# Patient Record
Sex: Male | Born: 1985 | Race: Black or African American | Hispanic: No | Marital: Single | Smoking: Current every day smoker
Health system: Southern US, Community
[De-identification: ages and names within clinical notes are randomized; demographics above are authoritative.]

## PROBLEM LIST (undated history)

## (undated) DIAGNOSIS — J45909 Unspecified asthma, uncomplicated: Secondary | ICD-10-CM

## (undated) HISTORY — PX: APPENDECTOMY: SHX54

---

## 2020-01-24 ENCOUNTER — Encounter: Payer: Self-pay | Admitting: Emergency Medicine

## 2020-01-24 ENCOUNTER — Ambulatory Visit
Admission: EM | Admit: 2020-01-24 | Discharge: 2020-01-24 | Disposition: A | Discharge status: Home / Self Care | Payer: Self-pay | Attending: Family Medicine | Admitting: Family Medicine

## 2020-01-24 ENCOUNTER — Ambulatory Visit (INDEPENDENT_AMBULATORY_CARE_PROVIDER_SITE_OTHER): Payer: Self-pay

## 2020-01-24 ENCOUNTER — Other Ambulatory Visit: Payer: Self-pay

## 2020-01-24 DIAGNOSIS — M25531 Pain in right wrist: Secondary | ICD-10-CM

## 2020-01-24 DIAGNOSIS — M79641 Pain in right hand: Secondary | ICD-10-CM

## 2020-01-24 HISTORY — DX: Unspecified asthma, uncomplicated: J45.909

## 2020-01-24 MED ORDER — KETOROLAC TROMETHAMINE 10 MG PO TABS: 10.0000 mg | ORAL_TABLET | Freq: Four times a day (QID) | ORAL | 0 refills | Status: DC | PRN

## 2020-01-24 NOTE — ED Triage Notes (Signed)
Patient states he punched a wall on Friday. He is now c/o right hand and wrist pain and swelling.

## 2020-01-24 NOTE — Discharge Instructions (Signed)
Rest, ice, elevation. ° °Medication as directed. ° °Take care ° °Dr. Kahmya Pinkham  °

## 2020-01-24 NOTE — ED Provider Notes (Signed)
[facility]    CSN: [id number] Arrival date & time: 01/24/20  5093      History   Chief Complaint Chief Complaint  Patient presents with  . Hand Injury  . Wrist Pain   HPI 34 year old male presents with the above complaint.  Patient reports that he got angry and punched a concrete wall on Friday.  Patient reports that he injured his right hand and wrist.  He reports severe pain of the right hand and wrist.  He has swelling of the digits.  No relieving factors.  Rates his pain as 9/10 in severity.  Exacerbated by touch and activity.  No other associated symptoms.  No other complaints.  Past Medical History:  Diagnosis Date  . Asthma    Past Surgical History:  Procedure Laterality Date  . APPENDECTOMY     Home Medications    Prior to Admission medications   Medication Sig Start Date End Date Taking? Authorizing Provider  ketorolac (TORADOL) 10 MG tablet Take 1 tablet (10 mg total) by mouth every 6 (six) hours as needed for moderate pain or severe pain. 01/24/20   Tommie Sams, DO   Social History Social History   Tobacco Use  . Smoking status: Current Every Day Smoker  . Smokeless tobacco: Never Used  Vaping Use  . Vaping Use: Never used  Substance Use Topics  . Alcohol use: Yes  . Drug use: Never     Allergies   Patient has no known allergies.   Review of Systems Review of Systems  Constitutional: Negative for fever.  Musculoskeletal:       Right hand and wrist pain.   Physical Exam Triage Vital Signs ED Triage Vitals  Enc Vitals Group     BP 01/24/20 0934 (!) 146/119     Pulse Rate 01/24/20 0934 98     Resp 01/24/20 0934 18     Temp 01/24/20 0934 98.3 F (36.8 C)     Temp Source 01/24/20 0934 Oral     SpO2 01/24/20 0934 98 %     Weight 01/24/20 0931 175 lb (79.4 kg)     Height 01/24/20 0931 5\' 7"  (1.702 m)     Head Circumference --      Peak Flow --      Pain Score 01/24/20 0931 9     Pain Loc --      Pain Edu? --      Excl. in  GC? --    Updated Vital Signs BP (!) 146/119 (BP Location: Left Arm)   Pulse 98   Temp 98.3 F (36.8 C) (Oral)   Resp 18   Ht 5\' 7"  (1.702 m)   Wt 79.4 kg   SpO2 98%   BMI 27.41 kg/m   Visual Acuity Right Eye Distance:   Left Eye Distance:   Bilateral Distance:    Right Eye Near:   Left Eye Near:    Bilateral Near:     Physical Exam Vitals and nursing note reviewed.  Constitutional:      General: He is not in acute distress.    Appearance: Normal appearance. He is not ill-appearing.  HENT:     Head: Normocephalic and atraumatic.  Eyes:     General:        Right eye: No discharge.        Left eye: No discharge.     Conjunctiva/sclera: Conjunctivae normal.  Pulmonary:     Effort: Pulmonary effort is normal. No  respiratory distress.  Musculoskeletal:     Comments: Right hand and wrist -diffuse swelling of the digits noted.  Patient exquisitely tender diffusely.  Most predominantly over the right middle finger as well as the ulnar aspect of the wrist.  Neurological:     Mental Status: He is alert.  Psychiatric:        Mood and Affect: Mood normal.        Behavior: Behavior normal.    UC Treatments / Results  Labs (all labs ordered are listed, but only abnormal results are displayed) Labs Reviewed - No data to display  EKG   Radiology DG Wrist Complete Right  Result Date: 01/24/2020 CLINICAL DATA:  Wrist pain after punching a wall. EXAM: RIGHT WRIST - COMPLETE 3+ VIEW COMPARISON:  Right hand radiographs 11/03/2017 FINDINGS: There is soft tissue swelling about the wrist. No fracture or dislocation is identified. Joint space widths are preserved. IMPRESSION: Soft tissue swelling without acute osseous abnormality. Electronically Signed   By: Sebastian Ache M.D.   On: 01/24/2020 10:08   DG Hand Complete Right  Result Date: 01/24/2020 CLINICAL DATA:  Hand pain greatest in the third MCP region after punching a wall. EXAM: RIGHT HAND - COMPLETE 3+ VIEW COMPARISON:   11/03/2017 FINDINGS: There is no evidence of fracture or dislocation. There is no evidence of arthropathy or other focal bone abnormality. Soft tissues are unremarkable. IMPRESSION: Negative. Electronically Signed   By: Sebastian Ache M.D.   On: 01/24/2020 10:07    Procedures Procedures (including critical care time)  Medications Ordered in UC Medications - No data to display  Initial Impression / Assessment and Plan / UC Course  I have reviewed the triage vital signs and the nursing notes.  Pertinent labs & imaging results that were available during my care of the patient were reviewed by me and considered in my medical decision making (see chart for details).    34 year old male presents with right hand and wrist pain after hitting a wall.  X-rays obtained today and independently reviewed by me.  Interpretation: Soft tissue swelling noted.  No fractures noted.  Advise rest, ice, elevation.  Toradol as needed.  Work note given.  Final Clinical Impressions(s) / UC Diagnoses   Final diagnoses:  Right hand pain  Right wrist pain     Discharge Instructions     Rest, ice, elevation.  Medication as directed.  Take care  Dr. Adriana Simas     ED Prescriptions    Medication Sig Dispense Auth. Provider   ketorolac (TORADOL) 10 MG tablet Take 1 tablet (10 mg total) by mouth every 6 (six) hours as needed for moderate pain or severe pain. 20 tablet Tommie Sams, DO     PDMP not reviewed this encounter.   Tommie Sams, [place] 01/24/20 1042

## 2020-01-27 ENCOUNTER — Ambulatory Visit
Admission: EM | Admit: 2020-01-27 | Discharge: 2020-01-27 | Disposition: A | Discharge status: Home / Self Care | Payer: Self-pay | Attending: Family Medicine | Admitting: Family Medicine

## 2020-01-27 ENCOUNTER — Other Ambulatory Visit: Payer: Self-pay

## 2020-01-27 DIAGNOSIS — M79641 Pain in right hand: Secondary | ICD-10-CM

## 2020-01-27 DIAGNOSIS — M25531 Pain in right wrist: Secondary | ICD-10-CM

## 2020-01-27 MED ORDER — MELOXICAM 15 MG PO TABS: 15.0000 mg | ORAL_TABLET | Freq: Every day | ORAL | 0 refills | Status: DC | PRN

## 2020-01-27 NOTE — Discharge Instructions (Signed)
Medication as prescribed.  Please call [facility] ([phone]) OR [facility] ([phone]) for an appt.  Take care  Dr. Emran Molzahn  

## 2020-01-27 NOTE — ED Provider Notes (Signed)
[facility]    CSN: [id number] Arrival date & time: 01/27/20  0949  History   Chief Complaint Chief Complaint  Patient presents with  . Hand Pain    Right hand and wrist   HPI  34 year old male presents with the above complaint.  Patient recently seen on 7/19.  Was evaluated by me.  Had struck a wall with his right hand.  X-ray was obtained of his hand and wrist and was negative.  Patient was discharged home on Toradol.  Patient reports that he went back to work and is now having worsening swelling and pain.  Has pain in the right hand particular the fourth and fifth digits.  Also has pain to the wrist.  Rates his pain as 10/10 in severity.  No relieving factors.  No other complaints at this time.  Past Medical History:  Diagnosis Date  . Asthma    Past Surgical History:  Procedure Laterality Date  . APPENDECTOMY     Home Medications    Prior to Admission medications   Medication Sig Start Date End Date Taking? Authorizing Provider  meloxicam (MOBIC) 15 MG tablet Take 1 tablet (15 mg total) by mouth daily as needed for pain. 01/27/20   Tommie Sams, DO   Social History Social History   Tobacco Use  . Smoking status: Current Every Day Smoker  . Smokeless tobacco: Never Used  Vaping Use  . Vaping Use: Never used  Substance Use Topics  . Alcohol use: Yes  . Drug use: Never     Allergies   Patient has no known allergies.   Review of Systems Review of Systems Per HPI  Physical Exam Triage Vital Signs ED Triage Vitals  Enc Vitals Group     BP 01/27/20 1008 (!) 144/95     Pulse Rate 01/27/20 1008 98     Resp 01/27/20 1008 18     Temp 01/27/20 1008 98.2 F (36.8 C)     Temp Source 01/27/20 1008 Oral     SpO2 01/27/20 1008 100 %     Weight 01/27/20 1012 180 lb (81.6 kg)     Height 01/27/20 1012 5\' 7"  (1.702 m)     Head Circumference --      Peak Flow --      Pain Score 01/27/20 1011 10     Pain Loc --      Pain Edu? --      Excl. in GC? --      Updated Vital Signs BP (!) 144/95 (BP Location: Left Arm)   Pulse 98   Temp 98.2 F (36.8 C) (Oral)   Resp 18   Ht 5\' 7"  (1.702 m)   Wt 81.6 kg   SpO2 100%   BMI 28.19 kg/m   Visual Acuity Right Eye Distance:   Left Eye Distance:   Bilateral Distance:    Right Eye Near:   Left Eye Near:    Bilateral Near:     Physical Exam Vitals and nursing note reviewed.  Constitutional:      General: He is not in acute distress.    Appearance: Normal appearance. He is not ill-appearing.  HENT:     Head: Normocephalic and atraumatic.  Pulmonary:     Effort: Pulmonary effort is normal. No respiratory distress.  Musculoskeletal:     Comments: Right hand and wrist -swelling of the digits noted.  Exquisitely tender to palpation throughout the hand and wrist.  Appears out of proportion.  Neurological:     Mental Status: He is alert.  Psychiatric:        Mood and Affect: Mood normal.        Behavior: Behavior normal.    UC Treatments / Results  Labs (all labs ordered are listed, but only abnormal results are displayed) Labs Reviewed - No data to display  EKG   Radiology No results found.  Procedures Procedures (including critical care time)  Medications Ordered in UC Medications - No data to display  Initial Impression / Assessment and Plan / UC Course  I have reviewed the triage vital signs and the nursing notes.  Pertinent labs & imaging results that were available during my care of the patient were reviewed by me and considered in my medical decision making (see chart for details).    34 year old male presents with persistent right hand and wrist pain after hitting a wall.  Placing on meloxicam.  Advised to see orthopedics.  Work note given.  Final Clinical Impressions(s) / UC Diagnoses   Final diagnoses:  Pain of right hand  Right wrist pain     Discharge Instructions     Medication as prescribed.  Please call [facility] [phone])  OR [facility] [phone]) for an appt.  Take care  Dr. Adriana Simas    ED Prescriptions    Medication Sig Dispense Auth. Provider   meloxicam (MOBIC) 15 MG tablet Take 1 tablet (15 mg total) by mouth daily as needed for pain. 30 tablet Tommie Sams, DO     PDMP not reviewed this encounter.   Tommie Sams, [place] 01/27/20 1115

## 2020-01-27 NOTE — ED Triage Notes (Signed)
Patient was seen Monday for this same c/o of hand and wrist swelling. Patient states pain in wrist and 4th and 5th digits of affected hand.

## 2020-02-10 ENCOUNTER — Ambulatory Visit
Admission: EM | Admit: 2020-02-10 | Discharge: 2020-02-10 | Disposition: A | Discharge status: Home / Self Care | Payer: Self-pay | Attending: Family Medicine | Admitting: Family Medicine

## 2020-02-10 ENCOUNTER — Other Ambulatory Visit: Payer: Self-pay

## 2020-02-10 DIAGNOSIS — R1013 Epigastric pain: Secondary | ICD-10-CM | POA: Insufficient documentation

## 2020-02-10 LAB — CBC WITH DIFFERENTIAL/PLATELET
Abs Immature Granulocytes: 0.04 10*3/uL (ref 0.00–0.07)
Basophils Absolute: 0.1 10*3/uL (ref 0.0–0.1)
Basophils Relative: 1 %
Eosinophils Absolute: 0.1 10*3/uL (ref 0.0–0.5)
Eosinophils Relative: 2 %
HCT: 43.4 % (ref 39.0–52.0)
Hemoglobin: 15.5 g/dL (ref 13.0–17.0)
Immature Granulocytes: 1 %
Lymphocytes Relative: 31 %
Lymphs Abs: 2.7 10*3/uL (ref 0.7–4.0)
MCH: 34.4 pg — ABNORMAL HIGH (ref 26.0–34.0)
MCHC: 35.7 g/dL (ref 30.0–36.0)
MCV: 96.2 fL (ref 80.0–100.0)
Monocytes Absolute: 0.9 10*3/uL (ref 0.1–1.0)
Monocytes Relative: 10 %
Neutro Abs: 4.9 10*3/uL (ref 1.7–7.7)
Neutrophils Relative %: 55 %
Platelets: 342 10*3/uL (ref 150–400)
RBC: 4.51 MIL/uL (ref 4.22–5.81)
RDW: 11.9 % (ref 11.5–15.5)
WBC: 8.7 10*3/uL (ref 4.0–10.5)
nRBC: 0 % (ref 0.0–0.2)

## 2020-02-10 LAB — COMPREHENSIVE METABOLIC PANEL
ALT: 19 U/L (ref 0–44)
AST: 27 U/L (ref 15–41)
Albumin: 4.3 g/dL (ref 3.5–5.0)
Alkaline Phosphatase: 76 U/L (ref 38–126)
Anion gap: 8 (ref 5–15)
BUN: 7 mg/dL (ref 6–20)
CO2: 24 mmol/L (ref 22–32)
Calcium: 9.2 mg/dL (ref 8.9–10.3)
Chloride: 101 mmol/L (ref 98–111)
Creatinine, Ser: 0.77 mg/dL (ref 0.61–1.24)
GFR calc Af Amer: >60 mL/min (ref >60)
GFR calc non Af Amer: >60 mL/min (ref >60)
Glucose, Bld: 110 mg/dL — ABNORMAL HIGH (ref 70–99)
Potassium: 4 mmol/L (ref 3.5–5.1)
Sodium: 133 mmol/L — ABNORMAL LOW (ref 135–145)
Total Bilirubin: 1.2 mg/dL (ref 0.3–1.2)
Total Protein: 8.2 g/dL — ABNORMAL HIGH (ref 6.5–8.1)

## 2020-02-10 LAB — LIPASE, BLOOD: Lipase: 23 U/L (ref 11–51)

## 2020-02-10 MED ORDER — PANTOPRAZOLE SODIUM 40 MG PO TBEC
40.0000 mg | DELAYED_RELEASE_TABLET | Freq: Every day | ORAL | 0 refills | Status: AC
Start: 2020-02-10 — End: ?

## 2020-02-10 MED ORDER — ONDANSETRON 4 MG PO TBDP
4.0000 mg | ORAL_TABLET | Freq: Three times a day (TID) | ORAL | 0 refills | Status: DC | PRN
Start: 2020-02-10 — End: 2020-11-03

## 2020-02-10 NOTE — Discharge Instructions (Signed)
Medication as prescribed.  Take care  Dr. Hitoshi Werts  

## 2020-02-10 NOTE — ED Triage Notes (Signed)
Pt states he was sent home from work yesterday for vomiting multiple times. No vomiting since yesterday but now having cramping abdominal pain. No diarrhea.

## 2020-02-10 NOTE — ED Provider Notes (Signed)
[facility]    CSN: [id number] Arrival date & time: 02/10/20  0920  History   Chief Complaint Chief Complaint  Patient presents with   Emesis   HPI 34 year old male presents with abdominal pain, nausea, vomiting.  Patient reports that he was sent home from work yesterday.  He had nausea and vomiting at work.  He reports some associated upper abdominal pain as well as periumbilical abdominal pain.  He has taken Pepto-Bismol and his vomiting has resolved.  However, his abdominal pain is not.  No diarrhea.  No fever.  Decreased appetite.  No other associated symptoms.  No other complaints.  Past Medical History:  Diagnosis Date   Asthma    Past Surgical History:  Procedure Laterality Date   APPENDECTOMY     Home Medications    Prior to Admission medications   Medication Sig Start Date End Date Taking? Authorizing Provider  ondansetron (ZOFRAN ODT) 4 MG disintegrating tablet Take 1 tablet (4 mg total) by mouth every 8 (eight) hours as needed for nausea or vomiting. 02/10/20   Everlene Other G, DO  pantoprazole (PROTONIX) 40 MG tablet Take 1 tablet (40 mg total) by mouth daily. 02/10/20   Tommie Sams, DO   Social History Social History   Tobacco Use   Smoking status: Current Every Day Smoker    Packs/day: 0.50    Types: Cigarettes   Smokeless tobacco: Never Used  Vaping Use   Vaping Use: Never used  Substance Use Topics   Alcohol use: Yes    Comment: social   Drug use: Never     Allergies   Patient has no known allergies.   Review of Systems Review of Systems  Constitutional: Positive for appetite change. Negative for fever.  Gastrointestinal: Positive for abdominal pain, nausea and vomiting.   Physical Exam Triage Vital Signs ED Triage Vitals  Enc Vitals Group     BP 02/10/20 0928 (!) 146/107     Pulse Rate 02/10/20 0928 94     Resp 02/10/20 0928 16     Temp 02/10/20 0928 98.4 F (36.9 C)     Temp Source 02/10/20 0928 Oral     SpO2  02/10/20 0928 100 %     Weight 02/10/20 0927 178 lb (80.7 kg)     Height 02/10/20 0927 5\' 7"  (1.702 m)     Head Circumference --      Peak Flow --      Pain Score 02/10/20 0927 10     Pain Loc --      Pain Edu? --      Excl. in GC? --    Updated Vital Signs BP (!) 146/107 (BP Location: Right Arm)    Pulse 94    Temp 98.4 F (36.9 C) (Oral)    Resp 16    Ht 5\' 7"  (1.702 m)    Wt 80.7 kg    SpO2 100%    BMI 27.88 kg/m   Visual Acuity Right Eye Distance:   Left Eye Distance:   Bilateral Distance:    Right Eye Near:   Left Eye Near:    Bilateral Near:     Physical Exam Vitals and nursing note reviewed.  Constitutional:      General: He is not in acute distress.    Appearance: Normal appearance. He is not ill-appearing.  HENT:     Head: Normocephalic and atraumatic.  Eyes:     General:  Right eye: No discharge.        Left eye: No discharge.     Conjunctiva/sclera: Conjunctivae normal.  Cardiovascular:     Rate and Rhythm: Normal rate and regular rhythm.     Heart sounds: No murmur heard.   Pulmonary:     Effort: Pulmonary effort is normal.     Breath sounds: Normal breath sounds. No wheezing, rhonchi or rales.  Abdominal:     Comments: Soft, nondistended.  Mild tenderness in the epigastric region.  Neurological:     Mental Status: He is alert.  Psychiatric:        Mood and Affect: Mood normal.        Behavior: Behavior normal.    UC Treatments / Results  Labs (all labs ordered are listed, but only abnormal results are displayed) Labs Reviewed  CBC WITH DIFFERENTIAL/PLATELET - Abnormal; Notable for the following components:      Result Value   MCH 34.4 (*)    All other components within normal limits  COMPREHENSIVE METABOLIC PANEL - Abnormal; Notable for the following components:   Sodium 133 (*)    Glucose, Bld 110 (*)    Total Protein 8.2 (*)    All other components within normal limits  LIPASE, BLOOD    EKG   Radiology No results  found.  Procedures Procedures (including critical care time)  Medications Ordered in UC Medications - No data to display  Initial Impression / Assessment and Plan / UC Course  I have reviewed the triage vital signs and the nursing notes.  Pertinent labs & imaging results that were available during my care of the patient were reviewed by me and considered in my medical decision making (see chart for details).    34 year old male presents with epigastric pain and recent nausea, vomiting.  Doing well at this time.  Labs obtained today and are unremarkable/reassuring.  Protonix and Zofran.  Supportive care.  Final Clinical Impressions(s) / UC Diagnoses   Final diagnoses:  Epigastric pain     Discharge Instructions     Medication as prescribed.  Take care  Dr. Adriana Simas    ED Prescriptions    Medication Sig Dispense Auth. Provider   pantoprazole (PROTONIX) 40 MG tablet Take 1 tablet (40 mg total) by mouth daily. 30 tablet Enid Maultsby G, DO   ondansetron (ZOFRAN ODT) 4 MG disintegrating tablet Take 1 tablet (4 mg total) by mouth every 8 (eight) hours as needed for nausea or vomiting. 20 tablet Tommie Sams, DO     PDMP not reviewed this encounter.   Tommie Sams, [place] 02/10/20 1042

## 2020-11-03 ENCOUNTER — Emergency Department
Admission: EM | Admit: 2020-11-03 | Discharge: 2020-11-03 | Disposition: A | Discharge status: Home / Self Care | Payer: Self-pay | Attending: Emergency Medicine | Admitting: Emergency Medicine

## 2020-11-03 ENCOUNTER — Other Ambulatory Visit: Payer: Self-pay

## 2020-11-03 DIAGNOSIS — K529 Noninfective gastroenteritis and colitis, unspecified: Secondary | ICD-10-CM | POA: Insufficient documentation

## 2020-11-03 DIAGNOSIS — R1084 Generalized abdominal pain: Secondary | ICD-10-CM

## 2020-11-03 DIAGNOSIS — J45909 Unspecified asthma, uncomplicated: Secondary | ICD-10-CM | POA: Insufficient documentation

## 2020-11-03 DIAGNOSIS — F17210 Nicotine dependence, cigarettes, uncomplicated: Secondary | ICD-10-CM | POA: Insufficient documentation

## 2020-11-03 LAB — CBC
HCT: 45.6 % (ref 39.0–52.0)
Hemoglobin: 15.8 g/dL (ref 13.0–17.0)
MCH: 33.8 pg (ref 26.0–34.0)
MCHC: 34.6 g/dL (ref 30.0–36.0)
MCV: 97.6 fL (ref 80.0–100.0)
Platelets: 338 10*3/uL (ref 150–400)
RBC: 4.67 MIL/uL (ref 4.22–5.81)
RDW: 11.9 % (ref 11.5–15.5)
WBC: 8.5 10*3/uL (ref 4.0–10.5)
nRBC: 0 % (ref 0.0–0.2)

## 2020-11-03 LAB — COMPREHENSIVE METABOLIC PANEL
ALT: 23 U/L (ref 0–44)
AST: 24 U/L (ref 15–41)
Albumin: 4.9 g/dL (ref 3.5–5.0)
Alkaline Phosphatase: 68 U/L (ref 38–126)
Anion gap: 13 (ref 5–15)
BUN: 10 mg/dL (ref 6–20)
CO2: 24 mmol/L (ref 22–32)
Calcium: 10 mg/dL (ref 8.9–10.3)
Chloride: 101 mmol/L (ref 98–111)
Creatinine, Ser: 0.91 mg/dL (ref 0.61–1.24)
GFR, Estimated: >60 mL/min (ref >60)
Glucose, Bld: 100 mg/dL — ABNORMAL HIGH (ref 70–99)
Potassium: 3.7 mmol/L (ref 3.5–5.1)
Sodium: 138 mmol/L (ref 135–145)
Total Bilirubin: 1 mg/dL (ref 0.3–1.2)
Total Protein: 8.7 g/dL — ABNORMAL HIGH (ref 6.5–8.1)

## 2020-11-03 LAB — LIPASE, BLOOD: Lipase: 25 U/L (ref 11–51)

## 2020-11-03 MED ORDER — ONDANSETRON 4 MG PO TBDP
4.0000 mg | ORAL_TABLET | Freq: Three times a day (TID) | ORAL | 0 refills | Status: DC | PRN
Start: 2020-11-03 — End: 2021-02-25

## 2020-11-03 MED ORDER — ONDANSETRON 4 MG PO TBDP
4.0000 mg | ORAL_TABLET | Freq: Once | ORAL | Status: AC
  Administered 2020-11-03: 4 mg via ORAL
  Filled 2020-11-03: qty 1

## 2020-11-03 MED ORDER — KETOROLAC TROMETHAMINE 30 MG/ML IJ SOLN
30.0000 mg | Freq: Once | INTRAMUSCULAR | Status: AC
  Administered 2020-11-03: 30 mg via INTRAMUSCULAR
  Filled 2020-11-03: qty 1

## 2020-11-03 NOTE — ED Triage Notes (Signed)
Pt to ER via POV with complaints of generalized abdominal pain and multiple episodes of emesis since Tuesday. Pt reports only being able to keep down Gatorade. Denies diarrhea or fevers at home. No urinary symptoms.

## 2020-11-03 NOTE — ED Notes (Signed)
Pt states he was able to drink water without throwing it back up, and a decrease in nausea and pain.

## 2020-11-03 NOTE — ED Provider Notes (Signed)
[facility] Emergency Department Provider Note   ____________________________________________   Event Date/Time   First MD Initiated Contact with Patient 11/03/20 1153     (approximate)  I have reviewed the triage vital signs and the nursing notes.   HISTORY  Chief Complaint Abdominal Pain    HPI Daniel Rangel is a 35 y.o. male with past medical history of asthma who presents to the ED complaining of abdominal pain.  Patient reports that he has had 3 days of intermittent cramping in his abdomen diffusely associated with diarrhea, nausea, and vomiting.  He states his children were sick with similar symptoms prior to his symptoms starting.  He has not had any fevers and denies any dysuria or flank pain.  He did notice a small amount of blood streaks in his vomit the last time he got sick.  He is status post appendectomy.        Past Medical History:  Diagnosis Date  . Asthma     There are no problems to display for this patient.   Past Surgical History:  Procedure Laterality Date  . APPENDECTOMY      Prior to Admission medications   Medication Sig Start Date End Date Taking? Authorizing Provider  ondansetron (ZOFRAN ODT) 4 MG disintegrating tablet Take 1 tablet (4 mg total) by mouth every 8 (eight) hours as needed for nausea or vomiting. 11/03/20  Yes Chesley Noon, MD  pantoprazole (PROTONIX) 40 MG tablet Take 1 tablet (40 mg total) by mouth daily. 02/10/20   Tommie Sams, DO    Allergies Patient has no known allergies.  No family history on file.  Social History Social History   Tobacco Use  . Smoking status: Current Every Day Smoker    Packs/day: 0.50    Types: Cigarettes  . Smokeless tobacco: Never Used  Vaping Use  . Vaping Use: Never used  Substance Use Topics  . Alcohol use: Yes    Comment: social  . Drug use: Never    Review of Systems  Constitutional: No fever/chills Eyes: No visual changes. ENT: No sore  throat. Cardiovascular: Denies chest pain. Respiratory: Denies shortness of breath. Gastrointestinal: Positive for abdominal pain, nausea, vomiting, and diarrhea.  No constipation. Genitourinary: Negative for dysuria. Musculoskeletal: Negative for back pain. Skin: Negative for rash. Neurological: Negative for headaches, focal weakness or numbness.  ____________________________________________   PHYSICAL EXAM:  VITAL SIGNS: ED Triage Vitals  Enc Vitals Group     BP 11/03/20 1140 (!) 156/100     Pulse Rate 11/03/20 1140 (!) 116     Resp 11/03/20 1140 16     Temp 11/03/20 1140 99.8 F (37.7 C)     Temp Source 11/03/20 1140 Oral     SpO2 11/03/20 1140 98 %     Weight 11/03/20 1141 190 lb (86.2 kg)     Height 11/03/20 1141 5\' 7"  (1.702 m)     Head Circumference --      Peak Flow --      Pain Score 11/03/20 1141 9     Pain Loc --      Pain Edu? --      Excl. in GC? --     Constitutional: Alert and oriented. Eyes: Conjunctivae are normal. Head: Atraumatic. Nose: No congestion/rhinnorhea. Mouth/Throat: Mucous membranes are moist. Neck: Normal ROM Cardiovascular: Normal rate, regular rhythm. Grossly normal heart sounds. Respiratory: Normal respiratory effort.  No retractions. Lungs CTAB. Gastrointestinal: Soft and nontender. No distention. Genitourinary: deferred Musculoskeletal:  No lower extremity tenderness nor edema. Neurologic:  Normal speech and language. No gross focal neurologic deficits are appreciated. Skin:  Skin is warm, dry and intact. No rash noted. Psychiatric: Mood and affect are normal. Speech and behavior are normal.  ____________________________________________   LABS (all labs ordered are listed, but only abnormal results are displayed)  Labs Reviewed  COMPREHENSIVE METABOLIC PANEL - Abnormal; Notable for the following components:      Result Value   Glucose, Bld 100 (*)    Total Protein 8.7 (*)    All other components within normal limits   LIPASE, BLOOD  CBC  URINALYSIS, COMPLETE (UACMP) WITH MICROSCOPIC    PROCEDURES  Procedure(s) performed (including Critical Care):  Procedures   ____________________________________________   INITIAL IMPRESSION / ASSESSMENT AND PLAN / ED COURSE       35 year old male with past medical history of asthma and appendectomy who presents to the ED with 3 days of diffuse abdominal cramping associated with vomiting and diarrhea.  Symptoms sound most consistent with a gastroenteritis is reassuring.  He denies any urinary symptoms and I have low suspicion for UTI.  We will treat with ODT Zofran and IM Toradol plan to reassess and p.o. challenge.  Patient reports feeling much better following ODT Zofran and IM Toradol, he has been able to tolerate p.o. without difficulty.  He is appropriate for discharge home with PCP follow-up, will be prescribed Zofran.  He was counseled to return to the ED for new worsening symptoms, patient agrees with plan.      ____________________________________________   FINAL CLINICAL IMPRESSION(S) / ED DIAGNOSES  Final diagnoses:  Generalized abdominal pain  Gastroenteritis     ED Discharge Orders         Ordered    ondansetron (ZOFRAN ODT) 4 MG disintegrating tablet  Every 8 hours PRN        11/03/20 1406           Note:  This document was prepared using Dragon voice recognition software and may include unintentional dictation errors.   Chesley Noon, MD 11/03/20 1407

## 2020-11-03 NOTE — ED Notes (Signed)
Pt c/o lower abdominal pain and N/V, states the pain is cramping and intermittant x3 days. Pt denies injury or heavy lifting. Pt is AOX4, NAD noted. Pt given urinal and instructed on use for sample.

## 2020-11-09 ENCOUNTER — Telehealth: Payer: Self-pay

## 2020-11-09 NOTE — Telephone Encounter (Signed)
After receiving an ER referral, called pt. The number was invalid.   MD 11/09/20 @ 2:20 pm

## 2020-12-31 ENCOUNTER — Emergency Department
Admission: EM | Admit: 2020-12-31 | Discharge: 2020-12-31 | Disposition: A | Discharge status: Home / Self Care | Payer: Self-pay | Attending: Emergency Medicine | Admitting: Emergency Medicine

## 2020-12-31 ENCOUNTER — Other Ambulatory Visit: Payer: Self-pay

## 2020-12-31 ENCOUNTER — Encounter: Payer: Self-pay | Admitting: Emergency Medicine

## 2020-12-31 DIAGNOSIS — F17210 Nicotine dependence, cigarettes, uncomplicated: Secondary | ICD-10-CM | POA: Insufficient documentation

## 2020-12-31 DIAGNOSIS — R1012 Left upper quadrant pain: Secondary | ICD-10-CM

## 2020-12-31 DIAGNOSIS — J45909 Unspecified asthma, uncomplicated: Secondary | ICD-10-CM | POA: Insufficient documentation

## 2020-12-31 DIAGNOSIS — K253 Acute gastric ulcer without hemorrhage or perforation: Secondary | ICD-10-CM | POA: Insufficient documentation

## 2020-12-31 LAB — CBC
HCT: 44.9 % (ref 39.0–52.0)
Hemoglobin: 15.9 g/dL (ref 13.0–17.0)
MCH: 34.3 pg — ABNORMAL HIGH (ref 26.0–34.0)
MCHC: 35.4 g/dL (ref 30.0–36.0)
MCV: 97 fL (ref 80.0–100.0)
Platelets: 307 10*3/uL (ref 150–400)
RBC: 4.63 MIL/uL (ref 4.22–5.81)
RDW: 11.7 % (ref 11.5–15.5)
WBC: 8.9 10*3/uL (ref 4.0–10.5)
nRBC: 0 % (ref 0.0–0.2)

## 2020-12-31 LAB — COMPREHENSIVE METABOLIC PANEL
ALT: 21 U/L (ref 0–44)
AST: 19 U/L (ref 15–41)
Albumin: 4.7 g/dL (ref 3.5–5.0)
Alkaline Phosphatase: 71 U/L (ref 38–126)
Anion gap: 12 (ref 5–15)
BUN: 8 mg/dL (ref 6–20)
CO2: 31 mmol/L (ref 22–32)
Calcium: 9.7 mg/dL (ref 8.9–10.3)
Chloride: 94 mmol/L — ABNORMAL LOW (ref 98–111)
Creatinine, Ser: 0.91 mg/dL (ref 0.61–1.24)
GFR, Estimated: >60 mL/min (ref >60)
Glucose, Bld: 98 mg/dL (ref 70–99)
Potassium: 2.9 mmol/L — ABNORMAL LOW (ref 3.5–5.1)
Sodium: 137 mmol/L (ref 135–145)
Total Bilirubin: 1.2 mg/dL (ref 0.3–1.2)
Total Protein: 8.5 g/dL — ABNORMAL HIGH (ref 6.5–8.1)

## 2020-12-31 LAB — LIPASE, BLOOD: Lipase: 22 U/L (ref 11–51)

## 2020-12-31 MED ORDER — POTASSIUM CHLORIDE CRYS ER 20 MEQ PO TBCR
40.0000 meq | EXTENDED_RELEASE_TABLET | Freq: Once | ORAL | Status: AC
  Administered 2020-12-31: 40 meq via ORAL
  Filled 2020-12-31: qty 2

## 2020-12-31 MED ORDER — FAMOTIDINE 20 MG PO TABS: 20.0000 mg | ORAL_TABLET | Freq: Two times a day (BID) | ORAL | 1 refills | Status: AC

## 2020-12-31 MED ORDER — LACTATED RINGERS IV BOLUS
1000.0000 mL | Freq: Once | INTRAVENOUS | Status: AC
  Administered 2020-12-31: 1000 mL via INTRAVENOUS

## 2020-12-31 MED ORDER — ONDANSETRON 4 MG PO TBDP
4.0000 mg | ORAL_TABLET | Freq: Once | ORAL | Status: AC | PRN
  Administered 2020-12-31: 4 mg via ORAL
  Filled 2020-12-31: qty 1

## 2020-12-31 MED ORDER — ALUM & MAG HYDROXIDE-SIMETH 200-200-20 MG/5ML PO SUSP
30.0000 mL | Freq: Once | ORAL | Status: AC
  Administered 2020-12-31: 30 mL via ORAL
  Filled 2020-12-31: qty 30

## 2020-12-31 MED ORDER — LIDOCAINE VISCOUS HCL 2 % MT SOLN
15.0000 mL | Freq: Once | OROMUCOSAL | Status: AC
  Administered 2020-12-31: 15 mL via ORAL
  Filled 2020-12-31: qty 15

## 2020-12-31 MED ORDER — FAMOTIDINE IN NACL 20-0.9 MG/50ML-% IV SOLN
20.0000 mg | Freq: Once | INTRAVENOUS | Status: AC
  Administered 2020-12-31: 20 mg via INTRAVENOUS
  Filled 2020-12-31: qty 50

## 2020-12-31 NOTE — ED Triage Notes (Signed)
Pt in via POV, reports mid left abdominal pain w/ N/V since Wednesday, states he is unable to keep anything down.  Ambulatory to triage, NAD noted at this time.

## 2020-12-31 NOTE — Discharge Instructions (Addendum)
You are being discharged with a prescription for famotidine/Pepcid to take twice daily every day moving forward to help reduce acid reflux.  I recommend that you follow-up with a GI doctor in the clinic to discuss the need for endoscopy to look at your stomach from the inside to see any ulcers that may be present.  You develop any worsening symptoms despite these measures, continued GI bleeding, fevers with your symptoms, please return to the ED.

## 2020-12-31 NOTE — ED Notes (Signed)
Patient is resting comfortably. 

## 2020-12-31 NOTE — ED Provider Notes (Signed)
[facility] Emergency Department Provider Note ____________________________________________   Event Date/Time   First MD Initiated Contact with Patient 12/31/20 1319     (approximate)  I have reviewed the triage vital signs and the nursing notes.  HISTORY  Chief Complaint Abdominal Pain   HPI Daniel Rangel is a 35 y.o. malewho presents to the ED for evaluation of abdominal pain, nausea vomiting.  Chart review indicates history of asthma.  Patient presents to the ED for evaluation of LUQ abdominal pain and postprandial emesis over the past 5 days.  Reports inability to keep anything down due to burning and cramping left-sided and upper abdominal pain.  He reports nausea and emesis.  He reports frequent belching and burping  Denies fevers, stool changes, dysuria, Substernal chest pain, shortness of breath or cough,  Patient reports drinking 2-3 beers per day every day.  Past Medical History:  Diagnosis Date   Asthma     There are no problems to display for this patient.   Past Surgical History:  Procedure Laterality Date   APPENDECTOMY      Prior to Admission medications   Medication Sig Start Date End Date Taking? Authorizing Provider  famotidine (PEPCID) 20 MG tablet Take 1 tablet (20 mg total) by mouth 2 (two) times daily. 12/31/20 12/31/21 Yes Delton Prairie, MD  ondansetron (ZOFRAN ODT) 4 MG disintegrating tablet Take 1 tablet (4 mg total) by mouth every 8 (eight) hours as needed for nausea or vomiting. 11/03/20   Chesley Noon, MD  pantoprazole (PROTONIX) 40 MG tablet Take 1 tablet (40 mg total) by mouth daily. 02/10/20   Tommie Sams, DO    Allergies Patient has no known allergies.  No family history on file.  Social History Social History   Tobacco Use   Smoking status: Every Day    Packs/day: 0.25    Pack years: 0.00    Types: Cigarettes   Smokeless tobacco: Never  Vaping Use   Vaping Use: Never used  Substance Use Topics    Alcohol use: Yes   Drug use: Never    Review of Systems  Constitutional: No fever/chills Eyes: No visual changes. ENT: No sore throat. Cardiovascular: Denies chest pain. Respiratory: Denies shortness of breath. Gastrointestinal: Positive for abdominal pain, nausea and vomiting. No diarrhea.  No constipation. Genitourinary: Negative for dysuria. Musculoskeletal: Negative for back pain. Skin: Negative for rash. Neurological: Negative for headaches, focal weakness or numbness.  ____________________________________________   PHYSICAL EXAM:  VITAL SIGNS: Vitals:   12/31/20 1330 12/31/20 1430  BP: (!) 153/113 (!) 163/117  Pulse: 91 82  Resp: 18 13  Temp:    SpO2: 98% 100%     Constitutional: Alert and oriented. Well appearing and in no acute distress. Eyes: Conjunctivae are normal. PERRL. EOMI. Head: Atraumatic. Nose: No congestion/rhinnorhea. Mouth/Throat: Mucous membranes are moist.  Oropharynx non-erythematous. Neck: No stridor. No cervical spine tenderness to palpation. Cardiovascular: Normal rate, regular rhythm. Grossly normal heart sounds.  Good peripheral circulation. Respiratory: Normal respiratory effort.  No retractions. Lungs CTAB. Gastrointestinal: Soft , nondistended,. No CVA tenderness. Mild epigastric and LUQ tenderness to palpation without peritoneal features.  Abdomen is otherwise benign.  No tenderness to the RUQ. Musculoskeletal: No lower extremity tenderness nor edema.  No joint effusions. No signs of acute trauma. Neurologic:  Normal speech and language. No gross focal neurologic deficits are appreciated. No gait instability noted. Skin:  Skin is warm, dry and intact. No rash noted. Psychiatric: Mood and affect are normal.  Speech and behavior are normal.  ____________________________________________   LABS (all labs ordered are listed, but only abnormal results are displayed)  Labs Reviewed  LIPASE, BLOOD  COMPREHENSIVE METABOLIC PANEL  CBC   URINALYSIS, COMPLETE (UACMP) WITH MICROSCOPIC   ____________________________________________  12 Lead EKG  Sinus rhythm, rate of 105 bpm, normal axis and intervals.  No evidence of acute ischemia. ____________________________________________  RADIOLOGY  ED MD interpretation:   Official radiology report(s): No results found.  ____________________________________________   PROCEDURES and INTERVENTIONS  Procedure(s) performed (including Critical Care):  .1-3 Lead EKG Interpretation  Date/Time: 12/31/2020 2:54 PM Performed by: Delton Prairie, MD Authorized by: Delton Prairie, MD     Interpretation: normal     ECG rate:  80   ECG rate assessment: normal     Rhythm: sinus rhythm     Ectopy: none     Conduction: normal    Medications  famotidine (PEPCID) IVPB 20 mg premix (20 mg Intravenous New Bag/Given 12/31/20 1440)  ondansetron (ZOFRAN-ODT) disintegrating tablet 4 mg (4 mg Oral Given 12/31/20 1237)  alum & mag hydroxide-simeth (MAALOX/MYLANTA) 200-200-20 MG/5ML suspension 30 mL (30 mLs Oral Given 12/31/20 1439)    And  lidocaine (XYLOCAINE) 2 % viscous mouth solution 15 mL (15 mLs Oral Given 12/31/20 1439)  lactated ringers bolus 1,000 mL (1,000 mLs Intravenous New Bag/Given 12/31/20 1440)    ____________________________________________   MDM / ED COURSE   Otherwise young/healthy 35 year old male presents to the ED with left upper quadrant abdominal pain and emesis, likely due to gastric ulcers or GERD, and ultimately amenable to outpatient management with initiation of an H2 blocker.  Exam initially with epigastric and LUQ tenderness, resolving after GI cocktail.  Otherwise benign exam.  Blood work pending the time of signout.  EKG is nonischemic.  Considering his resolution of symptoms after GI cocktail and Pepcid, no suspicious for GERD or peptic ulcer disease.  Less likely cholelithiasis.  Patient signed out to oncoming divider to follow-up on blood work and reassessment  with anticipation for outpatient management.  Clinical Course as of 12/31/20 1456  Sun Dec 31, 2020  1450 Reassessed.  Patient reports feeling much better after GI cocktail and Pepcid.  Examination of his abdomen reveals benign exam without tenderness.  He is requesting food and drink.  Awaiting blood work.  Patient will be signed out to oncoming provider to follow-up on this blood work and p.o. challenge.  Anticipate outpatient management. [DS]    Clinical Course User Index [DS] Delton Prairie, MD    ____________________________________________   FINAL CLINICAL IMPRESSION(S) / ED DIAGNOSES  Final diagnoses:  Left upper quadrant abdominal pain  Acute gastric ulcer without hemorrhage or perforation     ED Discharge Orders          Ordered    famotidine (PEPCID) 20 MG tablet  2 times daily        12/31/20 1455             Romel Dumond   Note:  This document was prepared using Conservation officer, historic buildings and may include unintentional dictation errors.    Delton Prairie, MD 12/31/20 [phone]

## 2020-12-31 NOTE — ED Notes (Signed)
Work note provided to patient

## 2020-12-31 NOTE — ED Provider Notes (Signed)
Patient received in signout from Dr. Katrinka Blazing pending follow-up on blood work.  Mildly low potassium.  Will replete orally.  He is tolerating p.o.  Repeat abdominal exam soft and benign.  No white count no fever.  As he is asymptomatic at this time do not feel that further diagnostic imaging or work-up clinically indicated at this time the patient will be appropriate for close outpatient follow-up.  He demonstrates understanding and agreeable to plan.  Discussed strict return precautions.   Willy Eddy, MD 12/31/20 [phone]

## 2021-02-25 ENCOUNTER — Other Ambulatory Visit: Payer: Self-pay

## 2021-02-25 ENCOUNTER — Emergency Department
Admission: EM | Admit: 2021-02-25 | Discharge: 2021-02-25 | Disposition: A | Discharge status: Home / Self Care | Payer: Self-pay | Attending: Emergency Medicine | Admitting: Emergency Medicine

## 2021-02-25 ENCOUNTER — Emergency Department: Payer: Self-pay

## 2021-02-25 ENCOUNTER — Encounter: Payer: Self-pay | Admitting: Emergency Medicine

## 2021-02-25 DIAGNOSIS — J45909 Unspecified asthma, uncomplicated: Secondary | ICD-10-CM | POA: Insufficient documentation

## 2021-02-25 DIAGNOSIS — F17210 Nicotine dependence, cigarettes, uncomplicated: Secondary | ICD-10-CM | POA: Insufficient documentation

## 2021-02-25 DIAGNOSIS — R112 Nausea with vomiting, unspecified: Secondary | ICD-10-CM | POA: Insufficient documentation

## 2021-02-25 DIAGNOSIS — E876 Hypokalemia: Secondary | ICD-10-CM | POA: Insufficient documentation

## 2021-02-25 DIAGNOSIS — R1084 Generalized abdominal pain: Secondary | ICD-10-CM

## 2021-02-25 LAB — URINALYSIS, COMPLETE (UACMP) WITH MICROSCOPIC
Bacteria, UA: NONE SEEN
Glucose, UA: NEGATIVE mg/dL
Hgb urine dipstick: NEGATIVE
Ketones, ur: 80 mg/dL — AB
Leukocytes,Ua: NEGATIVE
Nitrite: NEGATIVE
Protein, ur: 100 mg/dL — AB
Specific Gravity, Urine: >1.03 — ABNORMAL HIGH (ref 1.005–1.030)
pH: 6 (ref 5.0–8.0)

## 2021-02-25 LAB — COMPREHENSIVE METABOLIC PANEL
ALT: 22 U/L (ref 0–44)
AST: 21 U/L (ref 15–41)
Albumin: 4.8 g/dL (ref 3.5–5.0)
Alkaline Phosphatase: 77 U/L (ref 38–126)
Anion gap: 11 (ref 5–15)
BUN: 10 mg/dL (ref 6–20)
CO2: 28 mmol/L (ref 22–32)
Calcium: 9.7 mg/dL (ref 8.9–10.3)
Chloride: 100 mmol/L (ref 98–111)
Creatinine, Ser: 0.74 mg/dL (ref 0.61–1.24)
GFR, Estimated: >60 mL/min (ref >60)
Glucose, Bld: 113 mg/dL — ABNORMAL HIGH (ref 70–99)
Potassium: 3.2 mmol/L — ABNORMAL LOW (ref 3.5–5.1)
Sodium: 139 mmol/L (ref 135–145)
Total Bilirubin: 1.2 mg/dL (ref 0.3–1.2)
Total Protein: 8.6 g/dL — ABNORMAL HIGH (ref 6.5–8.1)

## 2021-02-25 LAB — CBC
HCT: 45.6 % (ref 39.0–52.0)
Hemoglobin: 16 g/dL (ref 13.0–17.0)
MCH: 34 pg (ref 26.0–34.0)
MCHC: 35.1 g/dL (ref 30.0–36.0)
MCV: 97 fL (ref 80.0–100.0)
Platelets: 319 10*3/uL (ref 150–400)
RBC: 4.7 MIL/uL (ref 4.22–5.81)
RDW: 11.8 % (ref 11.5–15.5)
WBC: 9.8 10*3/uL (ref 4.0–10.5)
nRBC: 0 % (ref 0.0–0.2)

## 2021-02-25 LAB — LIPASE, BLOOD: Lipase: 25 U/L (ref 11–51)

## 2021-02-25 LAB — MAGNESIUM: Magnesium: 2.5 mg/dL — ABNORMAL HIGH (ref 1.7–2.4)

## 2021-02-25 MED ORDER — IOHEXOL 350 MG/ML SOLN
100.0000 mL | Freq: Once | INTRAVENOUS | Status: AC | PRN
  Administered 2021-02-25: 100 mL via INTRAVENOUS
  Filled 2021-02-25: qty 100

## 2021-02-25 MED ORDER — DICYCLOMINE HCL 10 MG PO CAPS
10.0000 mg | ORAL_CAPSULE | Freq: Once | ORAL | Status: AC
  Administered 2021-02-25: 10 mg via ORAL
  Filled 2021-02-25: qty 1

## 2021-02-25 MED ORDER — LACTATED RINGERS IV BOLUS
1000.0000 mL | Freq: Once | INTRAVENOUS | Status: AC
  Administered 2021-02-25: 1000 mL via INTRAVENOUS

## 2021-02-25 MED ORDER — MORPHINE SULFATE (PF) 4 MG/ML IV SOLN
4.0000 mg | Freq: Once | INTRAVENOUS | Status: AC
  Administered 2021-02-25: 4 mg via INTRAVENOUS
  Filled 2021-02-25: qty 1

## 2021-02-25 MED ORDER — POTASSIUM CHLORIDE CRYS ER 20 MEQ PO TBCR
40.0000 meq | EXTENDED_RELEASE_TABLET | Freq: Once | ORAL | Status: AC
  Administered 2021-02-25: 40 meq via ORAL
  Filled 2021-02-25: qty 2

## 2021-02-25 MED ORDER — ONDANSETRON HCL 4 MG PO TABS: 4.0000 mg | ORAL_TABLET | Freq: Three times a day (TID) | ORAL | 0 refills | Status: AC | PRN

## 2021-02-25 MED ORDER — DICYCLOMINE HCL 10 MG PO CAPS: 10.0000 mg | ORAL_CAPSULE | Freq: Four times a day (QID) | ORAL | 0 refills | Status: AC

## 2021-02-25 NOTE — Discharge Instructions (Addendum)
Your blood pressure was noted to be elevated today.  Please have this rechecked when he can get into see her primary care doctor in the next 5 to 7 days

## 2021-02-25 NOTE — ED Notes (Signed)
Pt with CT at this time 

## 2021-02-25 NOTE — ED Triage Notes (Signed)
Pt via POV from home. Pt c/o lower abd pain since Friday morning. Pt states that on Saturday he was unable to eat or drink. Pt states he is having NV. Denies diarrhea. Denies fever. Pt has a hx gastris and appendectomy. Pt is A&Ox4 and NAD.

## 2021-02-25 NOTE — ED Provider Notes (Signed)
[facility] Emergency Department Provider Note  ____________________________________________   Event Date/Time   First MD Initiated Contact with Patient 02/25/21 2005     (approximate)  I have reviewed the triage vital signs and the nursing notes.   HISTORY  Chief Complaint Abdominal Pain   HPI Daniel Rangel is a 35 y.o. male with a past medical history of asthma and remote EtOH abuse stating has not had any alcohol in over a month as well as status post appendectomy who presents for assessment approximately 3 days of some generalized crampy abdominal pain associate with nonbloody nonbilious vomiting.  He has not had any diarrhea, cough, fevers, chest pain, headache, earache, sore throat or urinary symptoms.  No rash or extremity pain.  No significant recent NSAID use.  No other acute concerns at this time.  He has been having regular bowel movements.         Past Medical History:  Diagnosis Date   Asthma     There are no problems to display for this patient.   Past Surgical History:  Procedure Laterality Date   APPENDECTOMY      Prior to Admission medications   Medication Sig Start Date End Date Taking? Authorizing Provider  dicyclomine (BENTYL) 10 MG capsule Take 1 capsule (10 mg total) by mouth 4 (four) times daily for 3 days. 02/25/21 02/28/21 Yes Gilles Chiquito, MD  ondansetron (ZOFRAN) 4 MG tablet Take 1 tablet (4 mg total) by mouth every 8 (eight) hours as needed for up to 10 doses for nausea or vomiting. 02/25/21  Yes Gilles Chiquito, MD  famotidine (PEPCID) 20 MG tablet Take 1 tablet (20 mg total) by mouth 2 (two) times daily. 12/31/20 12/31/21  Delton Prairie, MD  pantoprazole (PROTONIX) 40 MG tablet Take 1 tablet (40 mg total) by mouth daily. 02/10/20   Tommie Sams, DO    Allergies Patient has no known allergies.  History reviewed. No pertinent family history.  Social History Social History   Tobacco Use   Smoking status: Every Day     Packs/day: 0.25    Types: Cigarettes   Smokeless tobacco: Never  Vaping Use   Vaping Use: Never used  Substance Use Topics   Alcohol use: Yes   Drug use: Never    Review of Systems  Review of Systems  Constitutional:  Negative for chills and fever.  HENT:  Negative for sore throat.   Eyes:  Negative for pain.  Respiratory:  Negative for cough and stridor.   Cardiovascular:  Negative for chest pain.  Gastrointestinal:  Positive for abdominal pain, nausea and vomiting.  Genitourinary:  Negative for dysuria.  Musculoskeletal:  Negative for myalgias.  Skin:  Negative for rash.  Neurological:  Negative for seizures, loss of consciousness and headaches.  Psychiatric/Behavioral:  Negative for suicidal ideas.   All other systems reviewed and are negative.    ____________________________________________   PHYSICAL EXAM:  VITAL SIGNS: ED Triage Vitals  Enc Vitals Group     BP 02/25/21 1618 (!) 144/115     Pulse Rate 02/25/21 1618 98     Resp 02/25/21 1618 18     Temp 02/25/21 1618 98.6 F (37 C)     Temp Source 02/25/21 1618 Oral     SpO2 02/25/21 1618 100 %     Weight 02/25/21 1619 188 lb (85.3 kg)     Height 02/25/21 1619 5' 7.5" (1.715 m)     Head Circumference --  Peak Flow --      Pain Score 02/25/21 1618 10     Pain Loc --      Pain Edu? --      Excl. in GC? --    Vitals:   02/25/21 2145 02/25/21 2200  BP:  (!) 160/97  Pulse: 80 73  Resp:    Temp:    SpO2: 100% 100%   Physical Exam Vitals and nursing note reviewed.  Constitutional:      Appearance: He is well-developed.  HENT:     Head: Normocephalic and atraumatic.     Right Ear: External ear normal.     Left Ear: External ear normal.     Nose: Nose normal.     Mouth/Throat:     Mouth: Mucous membranes are dry.  Eyes:     Conjunctiva/sclera: Conjunctivae normal.  Cardiovascular:     Rate and Rhythm: Normal rate and regular rhythm.     Heart sounds: No murmur heard. Pulmonary:      Effort: Pulmonary effort is normal. No respiratory distress.     Breath sounds: Normal breath sounds.  Abdominal:     Palpations: Abdomen is soft.     Tenderness: There is generalized abdominal tenderness. There is no right CVA tenderness or left CVA tenderness.  Musculoskeletal:     Cervical back: Neck supple.  Skin:    General: Skin is warm and dry.     Capillary Refill: Capillary refill takes 2 to 3 seconds.  Neurological:     Mental Status: He is alert and oriented to person, place, and time.  Psychiatric:        Mood and Affect: Mood normal.     ____________________________________________   LABS (all labs ordered are listed, but only abnormal results are displayed)  Labs Reviewed  COMPREHENSIVE METABOLIC PANEL - Abnormal; Notable for the following components:      Result Value   Potassium 3.2 (*)    Glucose, Bld 113 (*)    Total Protein 8.6 (*)    All other components within normal limits  URINALYSIS, COMPLETE (UACMP) WITH MICROSCOPIC - Abnormal; Notable for the following components:   Specific Gravity, Urine >1.030 (*)    Bilirubin Urine LARGE (*)    Ketones, ur 80 (*)    Protein, ur 100 (*)    All other components within normal limits  MAGNESIUM - Abnormal; Notable for the following components:   Magnesium 2.5 (*)    All other components within normal limits  LIPASE, BLOOD  CBC   ____________________________________________  EKG  ____________________________________________  RADIOLOGY  ED MD interpretation: CT abdomen pelvis shows no evidence of kidney stone, pyelonephritis, cholecystitis, pancreatitis, diverticulitis or other acute abdominal pelvic process.  Official radiology report(s): CT ABDOMEN PELVIS W CONTRAST  Result Date: 02/25/2021 CLINICAL DATA:  Abdominal pain, acute, nonlocalized EXAM: CT ABDOMEN AND PELVIS WITH CONTRAST TECHNIQUE: Multidetector CT imaging of the abdomen and pelvis was performed using the standard protocol following bolus  administration of intravenous contrast. CONTRAST:  OMNIPAQUE IOHEXOL 350 MG/ML SOLN COMPARISON:  04/07/2018 FINDINGS: Lower chest: Lung bases are clear. No effusions. Heart is normal size. Hepatobiliary: No focal hepatic abnormality. Gallbladder unremarkable. Pancreas: No focal abnormality or ductal dilatation. Spleen: No focal abnormality.  Normal size. Adrenals/Urinary Tract: No adrenal abnormality. No focal renal abnormality. No stones or hydronephrosis. Urinary bladder is unremarkable. Stomach/Bowel: Stomach, large and small bowel grossly unremarkable. Prior appendectomy. Vascular/Lymphatic: No evidence of aneurysm or adenopathy. Reproductive: No visible focal abnormality. Other:  No free fluid or free air. Musculoskeletal: No acute bony abnormality. IMPRESSION: No acute findings in the abdomen or pelvis. Electronically Signed   By: Charlett Nose M.D.   On: 02/25/2021 21:25    ____________________________________________   PROCEDURES  Procedure(s) performed (including Critical Care):  Procedures   ____________________________________________   INITIAL IMPRESSION / ASSESSMENT AND PLAN / ED COURSE     Presents with above to history exam for assessment of some crampy abdominal pain associate with nonbloody nonbilious vomiting.  On arrival he slowly hypertensive with otherwise stable vital signs on room air.  He has have some mild diffuse tenderness but is not peritonitis and has no guarding.  There is no CVA tenderness.  He does appear slightly dehydrated.  Primary differential includes acute factors gastritis, cholecystitis, pancreatitis, diverticulitis, SBO and kidney stone.  CT abdomen pelvis shows no evidence of kidney stone, pyelonephritis, cholecystitis, pancreatitis, diverticulitis or other acute abdominal pelvic process.  CMP shows a K of 3.2 which was repleted but no other significant electrolyte or metabolic derangements.  Lipase not consistent with acute pancreatitis.  CBC  shows no leukocytosis or acute anemia.  Magnesium unremarkable.  UA not suggestive of cystitis.  Patient feeling much better on reassessment and is able to tolerate p.o.  Suspect acute infectious gastritis.  Will write Rx for Zofran and Bentyl.  Discharged stable condition.  Strict return precautions advised and discussed.  Instructed to rest blood pressure rechecked in a couple days by PCP as well.      ____________________________________________   FINAL CLINICAL IMPRESSION(S) / ED DIAGNOSES  Final diagnoses:  Generalized abdominal pain  Nausea and vomiting, intractability of vomiting not specified, unspecified vomiting type  Hypokalemia    Medications  lactated ringers bolus 1,000 mL (1,000 mLs Intravenous New Bag/Given 02/25/21 2026)  potassium chloride SA (KLOR-CON) CR tablet 40 mEq (40 mEq Oral Given 02/25/21 2025)  morphine 4 MG/ML injection 4 mg (4 mg Intravenous Given 02/25/21 2028)  iohexol (OMNIPAQUE) 350 MG/ML injection 100 mL (100 mLs Intravenous Contrast Given 02/25/21 2107)  dicyclomine (BENTYL) capsule 10 mg (10 mg Oral Given 02/25/21 2202)     ED Discharge Orders          Ordered    ondansetron (ZOFRAN) 4 MG tablet  Every 8 hours PRN        02/25/21 2152    dicyclomine (BENTYL) 10 MG capsule  4 times daily        02/25/21 2152             Note:  This document was prepared using Dragon voice recognition software and may include unintentional dictation errors.    Gilles Chiquito, MD 02/25/21 2213

## 2022-04-23 ENCOUNTER — Emergency Department
Admission: EM | Admit: 2022-04-23 | Discharge: 2022-04-23 | Disposition: A | Discharge status: Home / Self Care | Payer: Self-pay

## 2022-04-23 NOTE — ED Triage Notes (Signed)
Ambulatory to triage with c/o asthma attack. Pt says sx have resolved and he does not wish to be seen. Pt ambulatory out of dept before obtaining MSE signature

## 2022-11-19 ENCOUNTER — Encounter: Payer: Self-pay | Admitting: *Deleted

## 2022-11-19 ENCOUNTER — Other Ambulatory Visit: Payer: Self-pay

## 2022-11-19 ENCOUNTER — Emergency Department
Admission: EM | Admit: 2022-11-19 | Discharge: 2022-11-20 | Disposition: A | Discharge status: Home / Self Care | Payer: Self-pay | Attending: Emergency Medicine | Admitting: Emergency Medicine

## 2022-11-19 DIAGNOSIS — J45909 Unspecified asthma, uncomplicated: Secondary | ICD-10-CM | POA: Insufficient documentation

## 2022-11-19 DIAGNOSIS — K2900 Acute gastritis without bleeding: Secondary | ICD-10-CM | POA: Insufficient documentation

## 2022-11-19 DIAGNOSIS — F1090 Alcohol use, unspecified, uncomplicated: Secondary | ICD-10-CM | POA: Insufficient documentation

## 2022-11-19 LAB — CBC
HCT: 45.6 % (ref 39.0–52.0)
Hemoglobin: 16.2 g/dL (ref 13.0–17.0)
MCH: 34.1 pg — ABNORMAL HIGH (ref 26.0–34.0)
MCHC: 35.5 g/dL (ref 30.0–36.0)
MCV: 96 fL (ref 80.0–100.0)
Platelets: 405 10*3/uL — ABNORMAL HIGH (ref 150–400)
RBC: 4.75 MIL/uL (ref 4.22–5.81)
RDW: 11.3 % — ABNORMAL LOW (ref 11.5–15.5)
WBC: 10.1 10*3/uL (ref 4.0–10.5)
nRBC: 0 % (ref 0.0–0.2)

## 2022-11-19 LAB — COMPREHENSIVE METABOLIC PANEL
ALT: 22 U/L (ref 0–44)
AST: 27 U/L (ref 15–41)
Albumin: 4.5 g/dL (ref 3.5–5.0)
Alkaline Phosphatase: 83 U/L (ref 38–126)
Anion gap: 13 (ref 5–15)
BUN: 9 mg/dL (ref 6–20)
CO2: 24 mmol/L (ref 22–32)
Calcium: 9.8 mg/dL (ref 8.9–10.3)
Chloride: 99 mmol/L (ref 98–111)
Creatinine, Ser: 0.94 mg/dL (ref 0.61–1.24)
GFR, Estimated: >60 mL/min (ref >60)
Glucose, Bld: 114 mg/dL — ABNORMAL HIGH (ref 70–99)
Potassium: 3.3 mmol/L — ABNORMAL LOW (ref 3.5–5.1)
Sodium: 136 mmol/L (ref 135–145)
Total Bilirubin: 1.2 mg/dL (ref 0.3–1.2)
Total Protein: 8.3 g/dL — ABNORMAL HIGH (ref 6.5–8.1)

## 2022-11-19 LAB — LIPASE, BLOOD: Lipase: 25 U/L (ref 11–51)

## 2022-11-19 NOTE — ED Triage Notes (Signed)
Pt has abd pain for 1 week.  Pt reports vomiting. No diarrhea.  Pt has decreased appetite.  Pt alert.

## 2022-11-20 ENCOUNTER — Emergency Department: Payer: Self-pay

## 2022-11-20 ENCOUNTER — Encounter: Payer: Self-pay | Admitting: Emergency Medicine

## 2022-11-20 MED ORDER — LACTATED RINGERS IV BOLUS
1000.0000 mL | Freq: Once | INTRAVENOUS | Status: AC
  Administered 2022-11-20: 1000 mL via INTRAVENOUS

## 2022-11-20 MED ORDER — IOHEXOL 300 MG/ML  SOLN
100.0000 mL | Freq: Once | INTRAMUSCULAR | Status: AC | PRN
  Administered 2022-11-20: 100 mL via INTRAVENOUS

## 2022-11-20 MED ORDER — OMEPRAZOLE MAGNESIUM 20 MG PO TBEC: 20.0000 mg | DELAYED_RELEASE_TABLET | Freq: Every day | ORAL | 1 refills | Status: AC

## 2022-11-20 MED ORDER — PANTOPRAZOLE SODIUM 40 MG IV SOLR
40.0000 mg | Freq: Once | INTRAVENOUS | Status: AC
  Administered 2022-11-20: 40 mg via INTRAVENOUS
  Filled 2022-11-20: qty 10

## 2022-11-20 MED ORDER — ONDANSETRON HCL 4 MG/2ML IJ SOLN
4.0000 mg | INTRAMUSCULAR | Status: AC
  Administered 2022-11-20: 4 mg via INTRAVENOUS
  Filled 2022-11-20: qty 2

## 2022-11-20 MED ORDER — SUCRALFATE 1 G PO TABS: 1.0000 g | ORAL_TABLET | Freq: Four times a day (QID) | ORAL | 1 refills | Status: AC | PRN

## 2022-11-20 MED ORDER — ONDANSETRON 4 MG PO TBDP: ORAL_TABLET | ORAL | 0 refills | Status: AC

## 2022-11-20 NOTE — Discharge Instructions (Signed)
Please try to avoid alcohol use and stick with a bland diet for the next few days.  Please go to the pharmacy and pick up the medications prescribed.  They should help get your stomach feeling better.  We strongly encourage you to call the office of Dr. Tobi Bastos to schedule follow-up appointment with the gastroenterologist.  You can also contact [facility] to schedule a follow-up appointment and to establish primary care.    Return to the emergency department if you develop new or worsening symptoms that concern you.

## 2022-11-20 NOTE — ED Provider Notes (Signed)
[facility] Provider Note    Event Date/Time   First MD Initiated Contact with Patient 11/19/22 2356     (approximate)   History   Abdominal Pain   HPI Daniel Rangel is a 37 y.o. male who states his past medical history includes well-controlled asthma and regular alcohol use.  He presents for evaluation of about a week of abdominal pain with persistent nausea and vomiting.  He has not had any diarrhea nor constipation but has had a substantially decreased appetite.  He said he has pain throughout his lower abdomen.  He also has a sore throat but he thinks that is because he has been vomiting so much.  He said he has not been drinking very much since the symptoms started but he usually drinks every day.  He said that sometimes the end cyst looks dark.  No recent fever, chest pain, nor shortness of breath.  He has not had symptoms like this in the past.  He smokes cigarettes and denies drug use.     Physical Exam   Triage Vital Signs: ED Triage Vitals  Enc Vitals Group     BP 11/19/22 2203 (!) 141/128     Pulse Rate 11/19/22 2203 (!) 109     Resp 11/19/22 2203 20     Temp 11/19/22 2203 98.2 F (36.8 C)     Temp Source 11/19/22 2203 Oral     SpO2 11/19/22 2203 99 %     Weight 11/19/22 2202 72.6 kg (160 lb)     Height 11/19/22 2202 1.702 m (5\' 7" )     Head Circumference --      Peak Flow --      Pain Score 11/19/22 2202 9     Pain Loc --      Pain Edu? --      Excl. in GC? --     Most recent vital signs: Vitals:   11/20/22 0212 11/20/22 0300  BP: (!) 151/109 (!) 148/103  Pulse: 74 79  Resp: 20   Temp:    SpO2: 100% 100%    General: Awake, no distress.  Appears uncomfortable but not toxic. CV:  Good peripheral perfusion.  Regular rate and rhythm. Resp:  Normal effort. Speaking easily and comfortably, no accessory muscle usage nor intercostal retractions.   Abd:  No distention.  Mild tenderness palpation throughout the lower abdomen, no rebound or  guarding.  No tenderness to palpation of the epigastrium nor right upper quadrant with negative Murphy sign.   ED Results / Procedures / Treatments   Labs (all labs ordered are listed, but only abnormal results are displayed) Labs Reviewed  COMPREHENSIVE METABOLIC PANEL - Abnormal; Notable for the following components:      Result Value   Potassium 3.3 (*)    Glucose, Bld 114 (*)    Total Protein 8.3 (*)    All other components within normal limits  CBC - Abnormal; Notable for the following components:   MCH 34.1 (*)    RDW 11.3 (*)    Platelets 405 (*)    All other components within normal limits  LIPASE, BLOOD     RADIOLOGY I viewed and interpreted the patient's CT of the abdomen and pelvis.  No evidence of diverticulitis, pneumo nor hemoperitoneum, nor other emergent abnormality.   Radiology report agrees.   PROCEDURES:  Critical Care performed: No  Procedures    IMPRESSION / MDM / ASSESSMENT AND PLAN / ED COURSE  I  reviewed the triage vital signs and the nursing notes.                              Differential diagnosis includes, but is not limited to, gastritis (possibly alcohol induced), esophagitis, diverticulitis, appendicitis, SBO/ileus, esophageal varices, Boerhaave syndrome, pancreatitis  Patient's presentation is most consistent with acute presentation with potential threat to life or bodily function.  Labs/studies ordered: CMP, lipase, CBC, CT of the abdomen and pelvis  Interventions/Medications given:  Medications  lactated ringers bolus 1,000 mL (0 mLs Intravenous Stopped 11/20/22 0315)  ondansetron (ZOFRAN) injection 4 mg (4 mg Intravenous Given 11/20/22 0150)  pantoprazole (PROTONIX) injection 40 mg (40 mg Intravenous Given 11/20/22 0151)  iohexol (OMNIPAQUE) 300 MG/ML solution 100 mL (100 mLs Intravenous Contrast Given 11/20/22 0326)    (Note:  hospital course my include additional interventions and/or labs/studies not listed above.)   Suspect  gastritis, likely induced from alcohol.  Labs are generally reassuring although he has a little bit of concentrated blood and is likely volume depleted in the setting of his recent vomiting.  He was initially mildly tachycardic although that resolved without intervention.  I ordered LR for rehydration as well as the Zofran and pantoprazole listed above to help with his symptoms.  Even though I think that an acute or emergent/surgical issue in his abdomen is unlikely, given the persistence of the symptoms I will proceed with CT of the abdomen and pelvis to rule out something that requires emergent intervention.  Anticipate he will need to follow-up with GI as an outpatient.     Clinical Course as of 11/20/22 0534  Wed Nov 20, 2022  0529 CT ABDOMEN PELVIS W CONTRAST As documented above, CT scan is reassuring with no evidence of an acute or emergent medical condition. [CF]  0530 Patient has been sleeping.  I woke him up and he said he feels better.  No additional vomiting.  Will treat for gastritis/esophagitis as documented above and with prescriptions as documented below. [CF]  0530 I gave him resources for how to establish primary care, but more importantly given resources for how to call GI to schedule follow-up appointment.  He says he understands .  I gave my usual and customary return precautions [CF]    Clinical Course User Index [CF] Loleta Rose, MD     FINAL CLINICAL IMPRESSION(S) / ED DIAGNOSES   Final diagnoses:  Acute gastritis, presence of bleeding unspecified, unspecified gastritis type     Rx / DC Orders   ED Discharge Orders          Ordered    omeprazole (PRILOSEC OTC) 20 MG tablet  Daily        11/20/22 0531    sucralfate (CARAFATE) 1 g tablet  4 times daily PRN        11/20/22 0531    ondansetron (ZOFRAN-ODT) 4 MG disintegrating tablet        11/20/22 0531             Note:  This document was prepared using Dragon voice recognition software and may include  unintentional dictation errors.   Loleta Rose, MD 11/20/22 [phone]

## 2022-11-20 NOTE — ED Notes (Signed)
Pt states nausea improved.

## 2022-11-20 NOTE — ED Notes (Signed)
Attempted iv insertion x2 without success, vanessa, rn in toa ttempt iv insertion.

## 2022-11-20 NOTE — ED Notes (Signed)
Iv started   meds given.  Family with pt 

## 2022-12-04 IMAGING — CT CT ABD-PELV W/ CM
2 of 4 series · 16 of 46 positions shown, 18 images · IV contrast (APPLIED)
Comparison: 04/07/2018

CLINICAL DATA: Abdominal pain, acute, nonlocalized

EXAM:
CT ABDOMEN AND PELVIS WITH CONTRAST
TECHNIQUE: Multidetector CT imaging of the abdomen and pelvis was performed
using the standard protocol following bolus administration of
intravenous contrast.
CONTRAST:  100mL OMNIPAQUE IOHEXOL 350 MG/ML SOLN

[Series 2: routine abd/pel with · axial · 0.89mm/px · z∈[-1164,-650]mm · 13 of 113 slices shown, 15 images]
[im 5/113  soft-tissue]
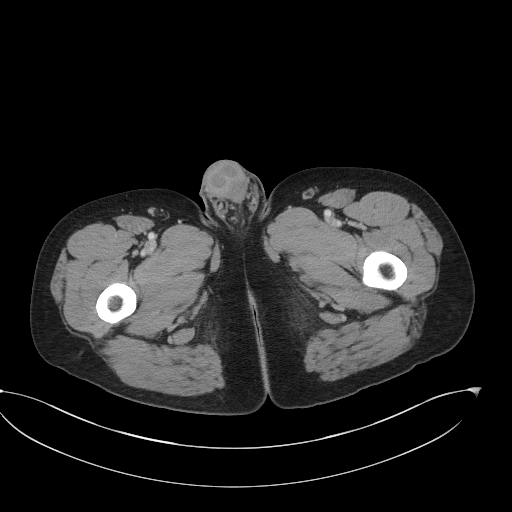
[im 5/113  bone]
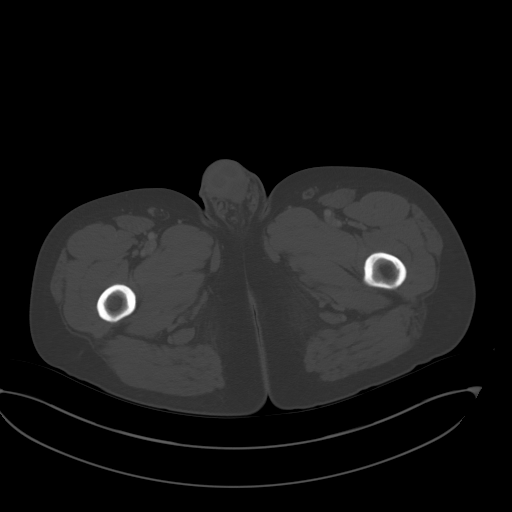
[im 15/113  soft-tissue]
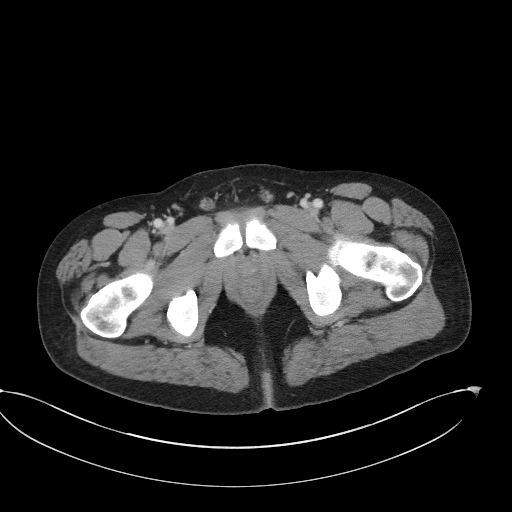
[im 24/113  soft-tissue]
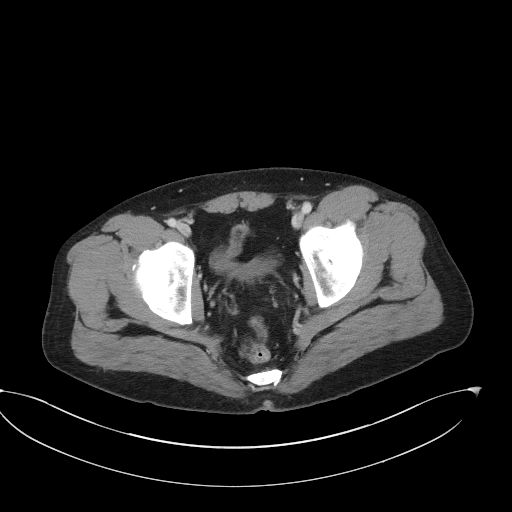
[im 33/113  soft-tissue]
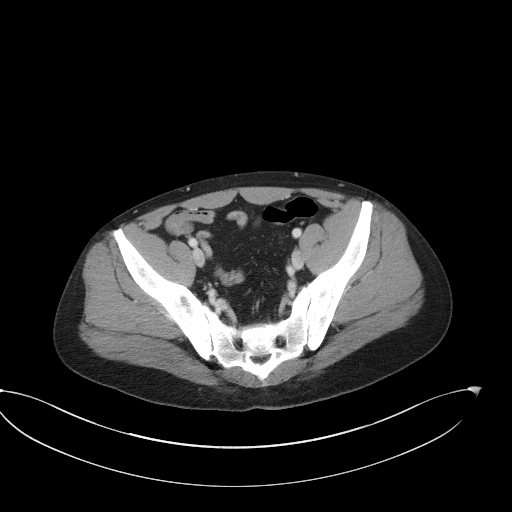
[im 38/113  soft-tissue]
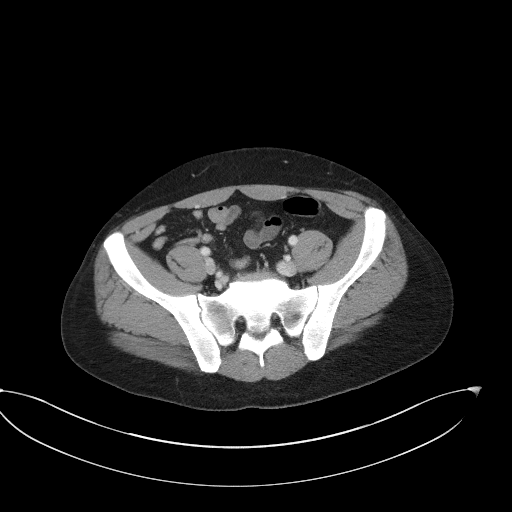
[im 47/113  soft-tissue]
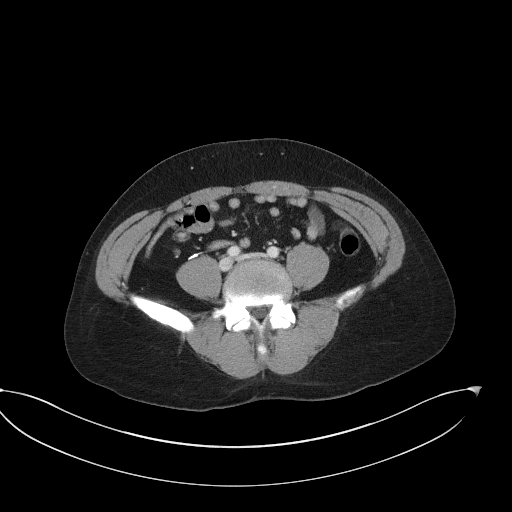
[im 57/113  soft-tissue]
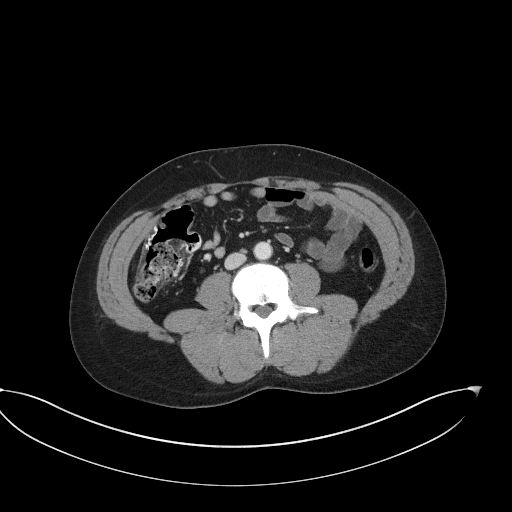
[im 66/113  soft-tissue]
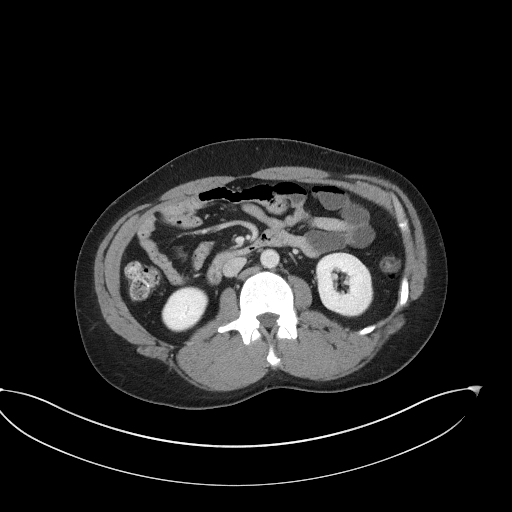
[im 75/113  soft-tissue]
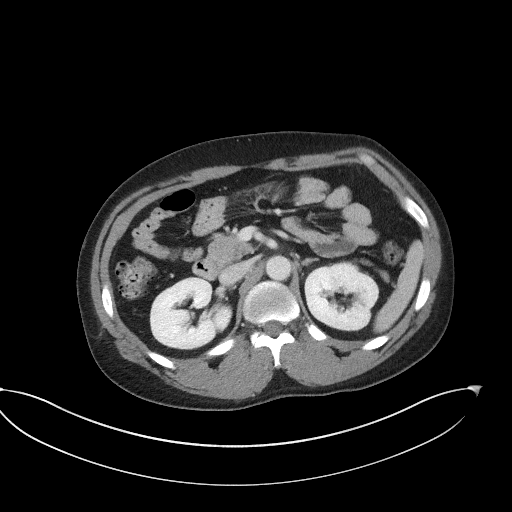
[im 75/113  bone]
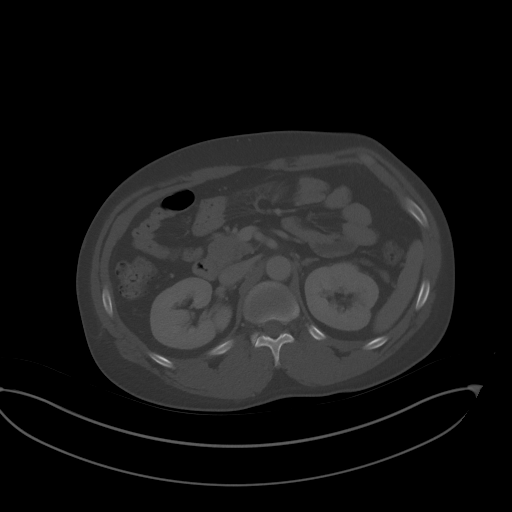
[im 80/113  soft-tissue]
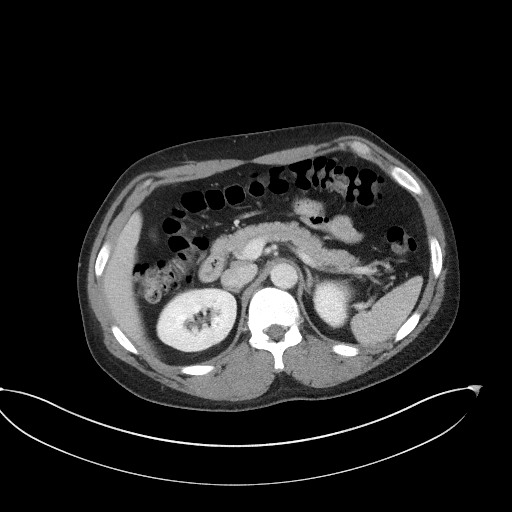
[im 89/113  soft-tissue]
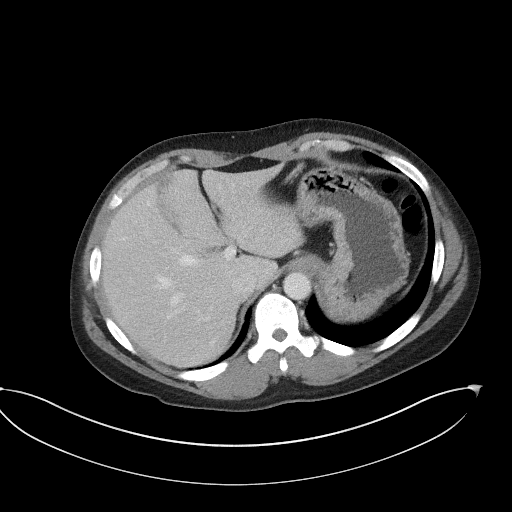
[im 99/113  soft-tissue]
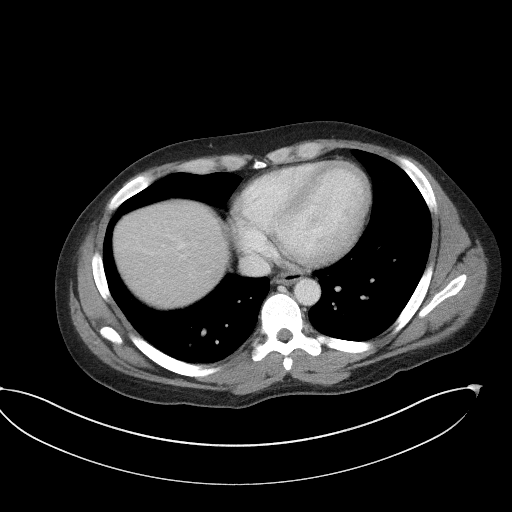
[im 108/113  soft-tissue]
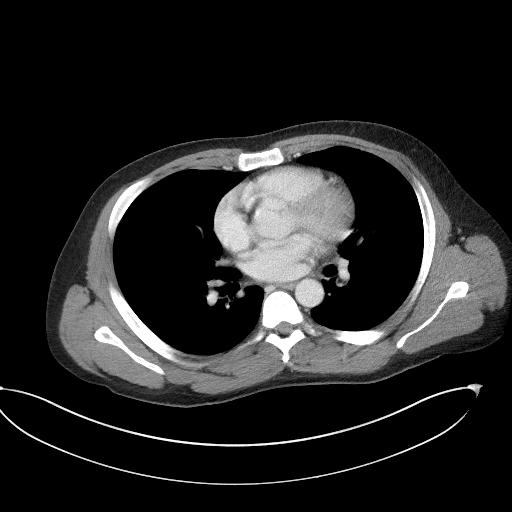

[Series 5: coronal st · coronal · 0.84mm/px · 3 of 87 slices shown]
[im 29/87  soft-tissue]
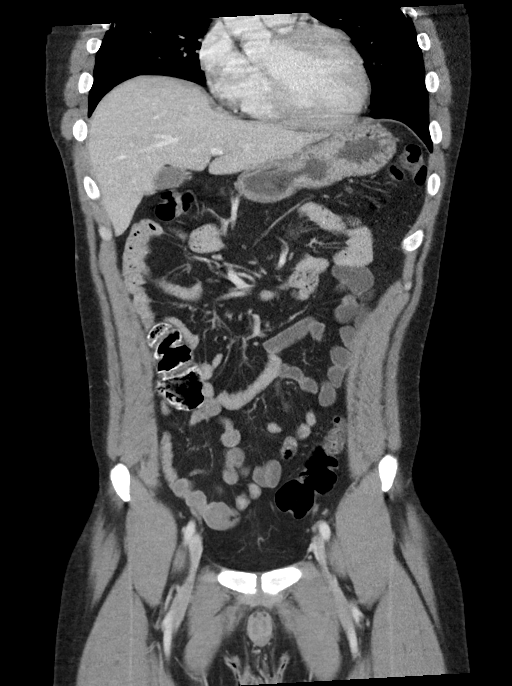
[im 39/87  soft-tissue]
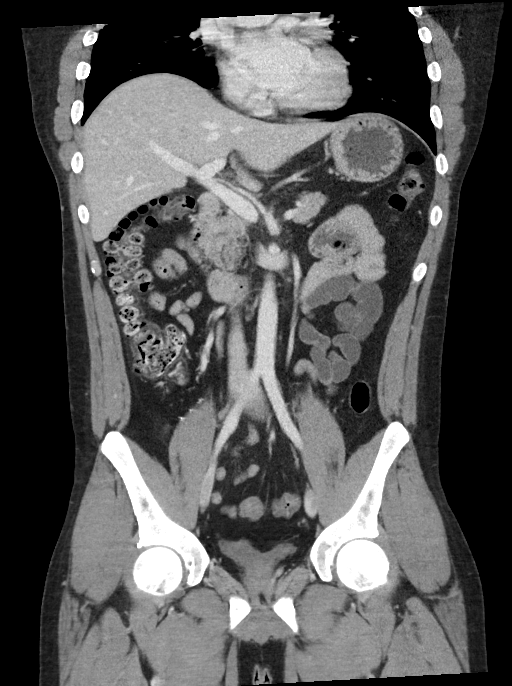
[im 48/87  soft-tissue]
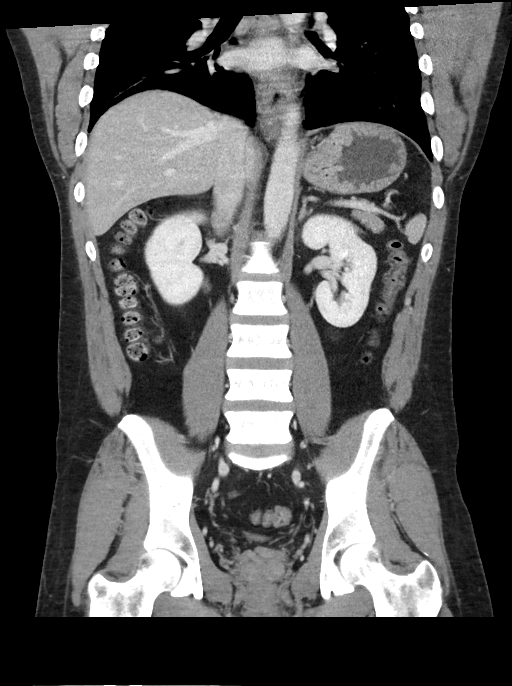

[16 of 46 positions shown; findings below may reference images not displayed]

FINDINGS: Lower chest: Lung bases are clear. No effusions. Heart is normal
size.

Hepatobiliary: No focal hepatic abnormality. Gallbladder
unremarkable.

Pancreas: No focal abnormality or ductal dilatation.

Spleen: No focal abnormality.  Normal size.

Adrenals/Urinary Tract: No adrenal abnormality. No focal renal
abnormality. No stones or hydronephrosis. Urinary bladder is
unremarkable.

Stomach/Bowel: Stomach, large and small bowel grossly unremarkable.
Prior appendectomy.

Vascular/Lymphatic: No evidence of aneurysm or adenopathy.

Reproductive: No visible focal abnormality.

Other: No free fluid or free air.

Musculoskeletal: No acute bony abnormality.
IMPRESSION: No acute findings in the abdomen or pelvis.
